# Patient Record
Sex: Female | Born: 1950 | Race: White | Hispanic: No | Marital: Married | Smoking: Never smoker
Health system: Southern US, Community
[De-identification: ages and names within clinical notes are randomized; demographics above are authoritative.]

## PROBLEM LIST (undated history)

## (undated) DIAGNOSIS — M1990 Unspecified osteoarthritis, unspecified site: Secondary | ICD-10-CM

## (undated) DIAGNOSIS — H04123 Dry eye syndrome of bilateral lacrimal glands: Secondary | ICD-10-CM

## (undated) DIAGNOSIS — T7840XA Allergy, unspecified, initial encounter: Secondary | ICD-10-CM

## (undated) DIAGNOSIS — K219 Gastro-esophageal reflux disease without esophagitis: Secondary | ICD-10-CM

## (undated) HISTORY — DX: Dry eye syndrome of bilateral lacrimal glands: H04.123

## (undated) HISTORY — PX: BREAST EXCISIONAL BIOPSY: SUR124

## (undated) HISTORY — PX: COLONOSCOPY: SHX174

## (undated) HISTORY — DX: Allergy, unspecified, initial encounter: T78.40XA

## (undated) HISTORY — PX: OTHER SURGICAL HISTORY: SHX169

## (undated) HISTORY — PX: MOLE REMOVAL: SHX2046

## (undated) HISTORY — PX: WISDOM TOOTH EXTRACTION: SHX21

## (undated) HISTORY — PX: TONSILLECTOMY: SUR1361

## (undated) HISTORY — DX: Unspecified osteoarthritis, unspecified site: M19.90

## (undated) HISTORY — PX: LIPOMA EXCISION: SHX5283

## (undated) HISTORY — DX: Gastro-esophageal reflux disease without esophagitis: K21.9

---

## 2007-02-23 ENCOUNTER — Other Ambulatory Visit: Admission: RE | Admit: 2007-02-23 | Discharge: 2007-02-23 | Payer: Self-pay | Admitting: Obstetrics and Gynecology

## 2009-02-23 ENCOUNTER — Other Ambulatory Visit: Admission: RE | Admit: 2009-02-23 | Discharge: 2009-02-23 | Payer: Self-pay | Admitting: Internal Medicine

## 2009-04-06 ENCOUNTER — Ambulatory Visit: Payer: Self-pay | Admitting: Internal Medicine

## 2009-05-03 ENCOUNTER — Ambulatory Visit: Payer: Self-pay | Admitting: Internal Medicine

## 2011-02-25 ENCOUNTER — Other Ambulatory Visit: Payer: Self-pay | Admitting: Internal Medicine

## 2012-06-21 ENCOUNTER — Other Ambulatory Visit (HOSPITAL_COMMUNITY)
Admission: RE | Admit: 2012-06-21 | Discharge: 2012-06-21 | Disposition: A | Discharge status: Home / Self Care | Payer: 59 | Source: Ambulatory Visit | Attending: Internal Medicine | Admitting: Internal Medicine

## 2012-06-21 DIAGNOSIS — Z1151 Encounter for screening for human papillomavirus (HPV): Secondary | ICD-10-CM | POA: Insufficient documentation

## 2012-06-21 DIAGNOSIS — Z01419 Encounter for gynecological examination (general) (routine) without abnormal findings: Secondary | ICD-10-CM | POA: Insufficient documentation

## 2012-11-23 ENCOUNTER — Ambulatory Visit (INDEPENDENT_AMBULATORY_CARE_PROVIDER_SITE_OTHER): Payer: 59 | Admitting: General Practice

## 2012-11-23 VITALS — BP 120/80 | HR 84 | Temp 97.9°F | Ht 66.0 in | Wt 159.0 lb

## 2012-11-23 DIAGNOSIS — J322 Chronic ethmoidal sinusitis: Secondary | ICD-10-CM

## 2012-11-23 MED ORDER — AZITHROMYCIN 250 MG PO TABS: ORAL_TABLET | ORAL | Status: DC

## 2012-11-23 NOTE — Progress Notes (Signed)
  Subjective:    Patient ID: Amanda Woodward, female    DOB: [birth year], 62 y.o.   MRN: [id number]  HPI Presents today with sinus drainage, blood tinged. Reports headache and sinus pressure. Reports taking zyrtec D with minimal relief. Reports onset of symptoms as Saturday.     Review of Systems  Constitutional: Positive for fever. Negative for chills.       Denies taking temperature   HENT: Positive for congestion, rhinorrhea, postnasal drip and sinus pressure. Negative for ear pain, sore throat and neck pain.   Respiratory: Negative for cough and chest tightness.   Cardiovascular: Negative for chest pain.  Gastrointestinal: Positive for vomiting.       Vomiting lasted from Saturday night through Sunday morning  Skin: Negative.   Neurological: Positive for headaches. Negative for dizziness.  Psychiatric/Behavioral: Negative.        Objective:   Physical Exam  Constitutional: She is oriented to person, place, and time. She appears well-developed and well-nourished.  HENT:  Head: Normocephalic and atraumatic.  Right Ear: External ear normal.  Left Ear: External ear normal.  Nose: Right sinus exhibits maxillary sinus tenderness. Left sinus exhibits maxillary sinus tenderness.  Eyes: Conjunctivae are normal.  Neck: Normal range of motion. No thyromegaly present.  Cardiovascular: Normal rate, regular rhythm and normal heart sounds.   No murmur heard. Pulmonary/Chest: Effort normal and breath sounds normal. No respiratory distress. She exhibits no tenderness.  Neurological: She is alert and oriented to person, place, and time.  Skin: Skin is warm and dry.  Psychiatric: She has a normal mood and affect.          Assessment & Plan:  Ethmoid sinusitis - Plan: azithromycin (ZITHROMAX) 250 MG tablet  Increase fluid intake Motrin or tylenol for discomfort Rest Patient verbalized understandingRTO if symptoms worsen Coralie Keens, FNP-C

## 2012-11-23 NOTE — Patient Instructions (Signed)

## 2013-03-07 ENCOUNTER — Ambulatory Visit (INDEPENDENT_AMBULATORY_CARE_PROVIDER_SITE_OTHER): Payer: 59 | Admitting: General Practice

## 2013-03-07 VITALS — BP 133/78 | HR 52 | Temp 97.5°F | Ht 66.5 in | Wt 166.0 lb

## 2013-03-07 DIAGNOSIS — N390 Urinary tract infection, site not specified: Secondary | ICD-10-CM

## 2013-03-07 DIAGNOSIS — R109 Unspecified abdominal pain: Secondary | ICD-10-CM

## 2013-03-07 LAB — POCT URINALYSIS DIPSTICK
Bilirubin, UA: NEGATIVE
Ketones, UA: NEGATIVE
Leukocytes, UA: NEGATIVE
Protein, UA: NEGATIVE
Spec Grav, UA: 1.01
pH, UA: 5

## 2013-03-07 LAB — POCT UA - MICROSCOPIC ONLY
Crystals, Ur, HPF, POC: NEGATIVE
Mucus, UA: NEGATIVE

## 2013-03-07 MED ORDER — CIPROFLOXACIN HCL 500 MG PO TABS: 500.0000 mg | ORAL_TABLET | Freq: Two times a day (BID) | ORAL | Status: DC

## 2013-03-07 NOTE — Progress Notes (Signed)
  Subjective:    Patient ID: Amanda Woodward, female    DOB: Aug 26, 1950, 62 y.o.   MRN: [id number]  Urinary Tract Infection  This is a new problem. The current episode started in the past 7 days. The problem occurs every urination. The problem has been gradually improving. The quality of the pain is described as aching. There has been no fever. She is sexually active. There is no history of pyelonephritis. Associated symptoms include flank pain, frequency, hematuria and urgency. Pertinent negatives include no chills. She has tried NSAIDs for the symptoms. There is no history of kidney stones or recurrent UTIs.      Review of Systems  Constitutional: Negative for fever and chills.  Respiratory: Negative for chest tightness and shortness of breath.   Cardiovascular: Negative for chest pain and palpitations.  Gastrointestinal: Negative for abdominal pain.  Genitourinary: Positive for dysuria, urgency, frequency, hematuria and flank pain. Negative for difficulty urinating.  Neurological: Negative for dizziness, weakness and headaches.       Objective:   Physical Exam  Constitutional: She is oriented to person, place, and time. She appears well-developed and well-nourished.  Cardiovascular: Normal rate, regular rhythm and normal heart sounds.   Pulmonary/Chest: Effort normal and breath sounds normal. No respiratory distress. She exhibits no tenderness.  Abdominal: Soft. Bowel sounds are normal. She exhibits no distension. There is no tenderness.  Neurological: She is alert and oriented to person, place, and time.  Skin: Skin is warm and dry.  Psychiatric: She has a normal mood and affect.   Results for orders placed in visit on 03/07/13  POCT UA - MICROSCOPIC ONLY      Result Value Range   WBC, Ur, HPF, POC occ     RBC, urine, microscopic 1-5     Bacteria, U Microscopic occ     Mucus, UA negative     Epithelial cells, urine per micros occ     Crystals, Ur, HPF, POC negative     Casts,  Ur, LPF, POC negative     Yeast, UA negative    POCT URINALYSIS DIPSTICK      Result Value Range   Color, UA gold     Clarity, UA clear     Glucose, UA negative     Bilirubin, UA negative     Ketones, UA negative     Spec Grav, UA 1.010     Blood, UA moderate     pH, UA 5.0     Protein, UA negative     Urobilinogen, UA negative     Nitrite, UA negative     Leukocytes, UA Negative           Assessment & Plan:  1. Abdominal discomfort - POCT UA - Microscopic Only - POCT urinalysis dipstick  2. Urinary tract infection, site not specified - ciprofloxacin (CIPRO) 500 MG tablet; Take 1 tablet (500 mg total) by mouth 2 (two) times daily.  Dispense: 20 tablet; Refill: 0 -Increase fluid intake Frequent voiding Proper perineal hygiene RTO prn  Patient verbalized understanding Coralie Keens, FNP-C

## 2013-03-07 NOTE — Patient Instructions (Addendum)
Urinary Tract Infection  Urinary tract infections (UTIs) can develop anywhere along your urinary tract. Your urinary tract is your body's drainage system for removing wastes and extra water. Your urinary tract includes two kidneys, two ureters, a bladder, and a urethra. Your kidneys are a pair of bean-shaped organs. Each kidney is about the size of your fist. They are located below your ribs, one on each side of your spine.  CAUSES  Infections are caused by microbes, which are microscopic organisms, including fungi, viruses, and bacteria. These organisms are so small that they can only be seen through a microscope. Bacteria are the microbes that most commonly cause UTIs.  SYMPTOMS   Symptoms of UTIs may vary by age and gender of the patient and by the location of the infection. Symptoms in young women typically include a frequent and intense urge to urinate and a painful, burning feeling in the bladder or urethra during urination. Older women and men are more likely to be tired, shaky, and weak and have muscle aches and abdominal pain. A fever may mean the infection is in your kidneys. Other symptoms of a kidney infection include pain in your back or sides below the ribs, nausea, and vomiting.  DIAGNOSIS  To diagnose a UTI, your caregiver will ask you about your symptoms. Your caregiver also will ask to provide a urine sample. The urine sample will be tested for bacteria and white blood cells. White blood cells are made by your body to help fight infection.  TREATMENT   Typically, UTIs can be treated with medication. Because most UTIs are caused by a bacterial infection, they usually can be treated with the use of antibiotics. The choice of antibiotic and length of treatment depend on your symptoms and the type of bacteria causing your infection.  HOME CARE INSTRUCTIONS   If you were prescribed antibiotics, take them exactly as your caregiver instructs you. Finish the medication even if you feel better after you  have only taken some of the medication.   Drink enough water and fluids to keep your urine clear or pale yellow.   Avoid caffeine, tea, and carbonated beverages. They tend to irritate your bladder.   Empty your bladder often. Avoid holding urine for long periods of time.   Empty your bladder before and after sexual intercourse.   After a bowel movement, women should cleanse from front to back. Use each tissue only once.  SEEK MEDICAL CARE IF:    You have back pain.   You develop a fever.   Your symptoms do not begin to resolve within 3 days.  SEEK IMMEDIATE MEDICAL CARE IF:    You have severe back pain or lower abdominal pain.   You develop chills.   You have nausea or vomiting.   You have continued burning or discomfort with urination.  MAKE SURE YOU:    Understand these instructions.   Will watch your condition.   Will get help right away if you are not doing well or get worse.  Document Released: 04/09/2005 Document Revised: 12/30/2011 Document Reviewed: 08/08/2011  [employer] Patient Information 2014 [employer].

## 2013-03-15 ENCOUNTER — Telehealth: Payer: Self-pay | Admitting: General Practice

## 2013-03-15 ENCOUNTER — Other Ambulatory Visit: Payer: Self-pay | Admitting: General Practice

## 2013-03-15 DIAGNOSIS — N760 Acute vaginitis: Secondary | ICD-10-CM

## 2013-03-15 MED ORDER — FLUCONAZOLE 150 MG PO TABS: 150.0000 mg | ORAL_TABLET | Freq: Once | ORAL | Status: DC

## 2013-03-15 NOTE — Telephone Encounter (Signed)
Left message, rx sent in.

## 2013-03-15 NOTE — Telephone Encounter (Signed)
Please inform that script sent for yeast infection

## 2013-03-15 NOTE — Telephone Encounter (Signed)
NTBS or call in rx?

## 2013-03-16 ENCOUNTER — Telehealth: Payer: Self-pay | Admitting: General Practice

## 2013-03-16 NOTE — Telephone Encounter (Signed)
Are we giving husband a rx ?

## 2013-03-17 NOTE — Telephone Encounter (Signed)
Please advise 

## 2013-03-18 NOTE — Telephone Encounter (Signed)
Pt aware, men do get yeast infections and can take meds for it but he should be seen and checked before a med could be prescribed.

## 2013-07-26 ENCOUNTER — Ambulatory Visit: Payer: 59 | Attending: Internal Medicine | Admitting: Physical Therapy

## 2013-07-26 DIAGNOSIS — M25519 Pain in unspecified shoulder: Secondary | ICD-10-CM | POA: Insufficient documentation

## 2013-07-26 DIAGNOSIS — IMO0001 Reserved for inherently not codable concepts without codable children: Secondary | ICD-10-CM | POA: Insufficient documentation

## 2013-07-26 DIAGNOSIS — M25619 Stiffness of unspecified shoulder, not elsewhere classified: Secondary | ICD-10-CM | POA: Insufficient documentation

## 2013-07-26 DIAGNOSIS — R5381 Other malaise: Secondary | ICD-10-CM | POA: Insufficient documentation

## 2013-07-28 ENCOUNTER — Ambulatory Visit: Payer: 59 | Admitting: Physical Therapy

## 2013-08-01 ENCOUNTER — Ambulatory Visit: Payer: 59 | Admitting: Physical Therapy

## 2013-08-05 ENCOUNTER — Ambulatory Visit: Payer: 59 | Admitting: *Deleted

## 2013-08-09 ENCOUNTER — Ambulatory Visit: Payer: 59 | Admitting: Physical Therapy

## 2013-08-12 ENCOUNTER — Ambulatory Visit: Payer: 59 | Admitting: Physical Therapy

## 2013-08-16 ENCOUNTER — Ambulatory Visit: Payer: 59 | Attending: Internal Medicine | Admitting: *Deleted

## 2013-08-16 DIAGNOSIS — R5381 Other malaise: Secondary | ICD-10-CM | POA: Insufficient documentation

## 2013-08-16 DIAGNOSIS — M25519 Pain in unspecified shoulder: Secondary | ICD-10-CM | POA: Insufficient documentation

## 2013-08-16 DIAGNOSIS — IMO0001 Reserved for inherently not codable concepts without codable children: Secondary | ICD-10-CM | POA: Insufficient documentation

## 2013-08-16 DIAGNOSIS — M25619 Stiffness of unspecified shoulder, not elsewhere classified: Secondary | ICD-10-CM | POA: Insufficient documentation

## 2013-08-19 ENCOUNTER — Ambulatory Visit: Payer: 59 | Admitting: *Deleted

## 2013-08-23 ENCOUNTER — Encounter: Payer: 59 | Admitting: Physical Therapy

## 2013-08-26 ENCOUNTER — Ambulatory Visit: Payer: 59 | Admitting: Physical Therapy

## 2013-08-30 ENCOUNTER — Encounter: Payer: 59 | Admitting: Physical Therapy

## 2013-09-02 ENCOUNTER — Encounter: Payer: 59 | Admitting: Physical Therapy

## 2014-03-10 ENCOUNTER — Encounter: Payer: Self-pay | Admitting: Internal Medicine

## 2014-04-04 ENCOUNTER — Ambulatory Visit: Payer: 59 | Admitting: Physical Therapy

## 2014-04-06 ENCOUNTER — Ambulatory Visit: Payer: 59 | Attending: Internal Medicine | Admitting: Physical Therapy

## 2014-04-06 DIAGNOSIS — M545 Low back pain, unspecified: Secondary | ICD-10-CM | POA: Diagnosis not present

## 2014-04-06 DIAGNOSIS — IMO0001 Reserved for inherently not codable concepts without codable children: Secondary | ICD-10-CM | POA: Insufficient documentation

## 2014-04-06 DIAGNOSIS — R5381 Other malaise: Secondary | ICD-10-CM | POA: Insufficient documentation

## 2014-04-11 ENCOUNTER — Ambulatory Visit: Payer: 59 | Admitting: *Deleted

## 2014-04-11 DIAGNOSIS — IMO0001 Reserved for inherently not codable concepts without codable children: Secondary | ICD-10-CM | POA: Diagnosis not present

## 2014-04-13 ENCOUNTER — Ambulatory Visit: Payer: 59 | Attending: Internal Medicine | Admitting: *Deleted

## 2014-04-13 DIAGNOSIS — R5381 Other malaise: Secondary | ICD-10-CM | POA: Insufficient documentation

## 2014-04-13 DIAGNOSIS — Z5189 Encounter for other specified aftercare: Secondary | ICD-10-CM | POA: Insufficient documentation

## 2014-04-13 DIAGNOSIS — M545 Low back pain: Secondary | ICD-10-CM | POA: Diagnosis not present

## 2014-04-18 ENCOUNTER — Ambulatory Visit: Payer: 59 | Admitting: *Deleted

## 2014-04-18 DIAGNOSIS — Z5189 Encounter for other specified aftercare: Secondary | ICD-10-CM | POA: Diagnosis not present

## 2014-04-20 ENCOUNTER — Ambulatory Visit: Payer: 59 | Admitting: *Deleted

## 2014-04-20 DIAGNOSIS — Z5189 Encounter for other specified aftercare: Secondary | ICD-10-CM | POA: Diagnosis not present

## 2014-04-26 ENCOUNTER — Encounter: Payer: 59 | Admitting: Physical Therapy

## 2014-05-05 ENCOUNTER — Encounter: Payer: Self-pay | Admitting: Family Medicine

## 2014-05-05 ENCOUNTER — Ambulatory Visit (INDEPENDENT_AMBULATORY_CARE_PROVIDER_SITE_OTHER): Payer: 59 | Admitting: Family Medicine

## 2014-05-05 ENCOUNTER — Telehealth: Payer: Self-pay | Admitting: Family Medicine

## 2014-05-05 VITALS — BP 126/73 | HR 85 | Temp 98.3°F | Ht 66.5 in | Wt 173.8 lb

## 2014-05-05 DIAGNOSIS — J206 Acute bronchitis due to rhinovirus: Secondary | ICD-10-CM

## 2014-05-05 MED ORDER — HYDROCOD POLST-CHLORPHEN POLST 10-8 MG/5ML PO LQCR: 5.0000 mL | Freq: Two times a day (BID) | ORAL | Status: DC | PRN

## 2014-05-05 MED ORDER — AZITHROMYCIN 250 MG PO TABS: ORAL_TABLET | ORAL | Status: DC

## 2014-05-05 NOTE — Progress Notes (Signed)
   Subjective:    Patient ID: Amanda Woodward, female    DOB: 10-01-1950, 63 y.o.   MRN: [id number]  HPI C/o cough and uri sx's  Review of Systems  Constitutional: Negative for fever.  HENT: Negative for ear pain.   Eyes: Negative for discharge.  Respiratory: Negative for cough.   Cardiovascular: Negative for chest pain.  Gastrointestinal: Negative for abdominal distention.  Endocrine: Negative for polyuria.  Genitourinary: Negative for difficulty urinating.  Musculoskeletal: Negative for gait problem and neck pain.  Skin: Negative for color change and rash.  Neurological: Negative for speech difficulty and headaches.  Psychiatric/Behavioral: Negative for agitation.       Objective:    BP 126/73  Pulse 85  Temp(Src) 98.3 F (36.8 C) (Oral)  Ht 5' 6.5" (1.689 m)  Wt 173 lb 12.8 oz (78.835 kg)  BMI 27.64 kg/m2 Physical Exam  Constitutional: She is oriented to person, place, and time. She appears well-developed and well-nourished.  HENT:  Head: Normocephalic and atraumatic.  Mouth/Throat: Oropharynx is clear and moist.  Eyes: Pupils are equal, round, and reactive to light.  Neck: Normal range of motion. Neck supple.  Cardiovascular: Normal rate and regular rhythm.   No murmur heard. Pulmonary/Chest: Effort normal and breath sounds normal.  Abdominal: Soft. Bowel sounds are normal. There is no tenderness.  Neurological: She is alert and oriented to person, place, and time.  Skin: Skin is warm and dry.  Psychiatric: She has a normal mood and affect.          Assessment & Plan:     ICD-9-CM ICD-10-CM   1. Acute bronchitis due to Rhinovirus 466.0 J20.6 azithromycin (ZITHROMAX) 250 MG tablet   079.3  chlorpheniramine-HYDROcodone (TUSSIONEX PENNKINETIC ER) 10-8 MG/5ML LQCR     No Follow-up on file.  Lysbeth Penner FNP

## 2014-05-12 ENCOUNTER — Ambulatory Visit: Payer: 59 | Admitting: *Deleted

## 2014-05-12 DIAGNOSIS — Z5189 Encounter for other specified aftercare: Secondary | ICD-10-CM | POA: Diagnosis not present

## 2014-05-16 ENCOUNTER — Ambulatory Visit: Payer: Managed Care, Other (non HMO) | Attending: Internal Medicine | Admitting: *Deleted

## 2014-05-16 DIAGNOSIS — M545 Low back pain: Secondary | ICD-10-CM | POA: Diagnosis not present

## 2014-05-16 DIAGNOSIS — Z5189 Encounter for other specified aftercare: Secondary | ICD-10-CM | POA: Diagnosis present

## 2014-05-16 DIAGNOSIS — R5381 Other malaise: Secondary | ICD-10-CM | POA: Insufficient documentation

## 2014-05-23 ENCOUNTER — Ambulatory Visit: Payer: Managed Care, Other (non HMO) | Admitting: Physical Therapy

## 2014-05-23 DIAGNOSIS — Z5189 Encounter for other specified aftercare: Secondary | ICD-10-CM | POA: Diagnosis not present

## 2014-05-30 ENCOUNTER — Encounter: Payer: 59 | Admitting: Physical Therapy

## 2014-05-31 ENCOUNTER — Telehealth: Payer: Self-pay | Admitting: Family Medicine

## 2014-05-31 NOTE — Telephone Encounter (Signed)
Pt given appt tomorrow @ 4:45 with MMM

## 2014-06-01 ENCOUNTER — Ambulatory Visit: Payer: 59 | Admitting: Nurse Practitioner

## 2014-09-13 ENCOUNTER — Encounter: Payer: Self-pay | Admitting: Internal Medicine

## 2014-09-18 ENCOUNTER — Other Ambulatory Visit (HOSPITAL_COMMUNITY)
Admission: RE | Admit: 2014-09-18 | Discharge: 2014-09-18 | Disposition: A | Discharge status: Home / Self Care | Payer: 59 | Source: Ambulatory Visit | Attending: Internal Medicine | Admitting: Internal Medicine

## 2014-09-18 ENCOUNTER — Other Ambulatory Visit: Payer: Self-pay | Admitting: Internal Medicine

## 2014-09-18 DIAGNOSIS — Z01419 Encounter for gynecological examination (general) (routine) without abnormal findings: Secondary | ICD-10-CM | POA: Insufficient documentation

## 2014-09-18 DIAGNOSIS — Z1151 Encounter for screening for human papillomavirus (HPV): Secondary | ICD-10-CM | POA: Diagnosis present

## 2014-09-20 LAB — CYTOLOGY - PAP

## 2015-03-30 ENCOUNTER — Emergency Department (HOSPITAL_COMMUNITY)
Admission: EM | Admit: 2015-03-30 | Discharge: 2015-03-30 | Disposition: A | Discharge status: Home / Self Care | Payer: Managed Care, Other (non HMO) | Attending: Emergency Medicine | Admitting: Emergency Medicine

## 2015-03-30 ENCOUNTER — Encounter (HOSPITAL_COMMUNITY): Payer: Self-pay | Admitting: Emergency Medicine

## 2015-03-30 ENCOUNTER — Encounter: Payer: Self-pay | Admitting: Physician Assistant

## 2015-03-30 ENCOUNTER — Ambulatory Visit (INDEPENDENT_AMBULATORY_CARE_PROVIDER_SITE_OTHER): Payer: Managed Care, Other (non HMO) | Admitting: Family Medicine

## 2015-03-30 ENCOUNTER — Emergency Department (HOSPITAL_COMMUNITY): Payer: Managed Care, Other (non HMO)

## 2015-03-30 VITALS — BP 134/84 | HR 56 | Temp 97.1°F | Ht 66.5 in | Wt 175.0 lb

## 2015-03-30 DIAGNOSIS — M542 Cervicalgia: Secondary | ICD-10-CM | POA: Insufficient documentation

## 2015-03-30 DIAGNOSIS — H9202 Otalgia, left ear: Secondary | ICD-10-CM | POA: Insufficient documentation

## 2015-03-30 DIAGNOSIS — R110 Nausea: Secondary | ICD-10-CM | POA: Diagnosis not present

## 2015-03-30 DIAGNOSIS — R079 Chest pain, unspecified: Secondary | ICD-10-CM

## 2015-03-30 DIAGNOSIS — R5383 Other fatigue: Secondary | ICD-10-CM | POA: Diagnosis not present

## 2015-03-30 LAB — BASIC METABOLIC PANEL
ANION GAP: 6 (ref 5–15)
BUN: 20 mg/dL (ref 6–20)
CHLORIDE: 106 mmol/L (ref 101–111)
CO2: 26 mmol/L (ref 22–32)
CREATININE: 0.92 mg/dL (ref 0.44–1.00)
Calcium: 9.1 mg/dL (ref 8.9–10.3)
GFR calc non Af Amer: >60 mL/min (ref >60)
Glucose, Bld: 107 mg/dL — ABNORMAL HIGH (ref 65–99)
POTASSIUM: 4.8 mmol/L (ref 3.5–5.1)
SODIUM: 138 mmol/L (ref 135–145)

## 2015-03-30 LAB — CBC
HCT: 41.3 % (ref 36.0–46.0)
Hemoglobin: 13.6 g/dL (ref 12.0–15.0)
MCH: 29.8 pg (ref 26.0–34.0)
MCHC: 32.9 g/dL (ref 30.0–36.0)
MCV: 90.6 fL (ref 78.0–100.0)
PLATELETS: 242 10*3/uL (ref 150–400)
RBC: 4.56 MIL/uL (ref 3.87–5.11)
RDW: 13.7 % (ref 11.5–15.5)
WBC: 6.9 10*3/uL (ref 4.0–10.5)

## 2015-03-30 LAB — I-STAT TROPONIN, ED
TROPONIN I, POC: 0 ng/mL (ref 0.00–0.08)
Troponin i, poc: 0 ng/mL (ref 0.00–0.08)

## 2015-03-30 MED ORDER — ASPIRIN EC 81 MG PO TBEC: 324.0000 mg | DELAYED_RELEASE_TABLET | Freq: Every day | ORAL | Status: DC

## 2015-03-30 MED ORDER — NITROGLYCERIN 0.4 MG SL SUBL
0.4000 mg | SUBLINGUAL_TABLET | Freq: Once | SUBLINGUAL | Status: AC
  Administered 2015-03-30: 0.4 mg via SUBLINGUAL

## 2015-03-30 MED ORDER — ASPIRIN 81 MG PO CHEW
324.0000 mg | CHEWABLE_TABLET | Freq: Once | ORAL | Status: AC
  Administered 2015-03-30: 324 mg via ORAL

## 2015-03-30 NOTE — ED Provider Notes (Signed)
CSN: [id number]     Arrival date & time 03/30/15  1128 History   First MD Initiated Contact with Patient 03/30/15 1524     Chief Complaint  Patient presents with  . Chest Pain   HPI  Amanda Woodward is a 64 year old female with PMHx of arthritis presenting today with chest pain. Pt states that around 9 AM this morning she began have left ear pain that radiated into her left jaw and teeth and further progressed down the left side of her neck into her chest. Pt describes the pain tight and heavy pressure. Pt denies lightheadedness, dizziness, diaphoresis, palpitations, nausea or vomiting with the chest pain. She decided to go to her urgent care in [place] where she received 4 ASA and 1 nitro. Pt reports that her chest pain was beginning to resolve approximately 15-20 minutes after it started and then fully resolved after receiving ASA and nitro. States the nitro made her nauseous and fatigued. Pt currently denies chest pain. No personal cardiac history. No family cardiac history. Denies fevers, chills, headache, dizziness, lightheadedness, syncope, SOB, palpitations, abdominal pain, vomiting.   Past Medical History  Diagnosis Date  . Arthritis    Past Surgical History  Procedure Laterality Date  . Tonsillectomy    . Cesarean section    . Wisdom tooth extraction    . Vein removal right leg    . Lipoma excision  LEFT SHOULDER   Family History  Problem Relation Age of Onset  . Cancer Mother   . Arthritis Mother   . COPD Father   . Cancer Father   . Heart disease Father    Social History  Substance Use Topics  . Smoking status: Never Smoker   . Smokeless tobacco: Never Used  . Alcohol Use: No   OB History    No data available     Review of Systems  Constitutional: Positive for fatigue. Negative for fever, chills and diaphoresis.  HENT: Positive for ear pain.   Respiratory: Positive for chest tightness. Negative for shortness of breath.   Cardiovascular: Positive for chest pain.  Negative for palpitations.  Gastrointestinal: Positive for nausea. Negative for vomiting, abdominal pain and diarrhea.  Musculoskeletal: Positive for neck pain. Negative for back pain.  Neurological: Negative for dizziness, syncope, weakness, light-headedness and headaches.      Allergies  Benadryl  Home Medications   Prior to Admission medications   Medication Sig Start Date End Date Taking? Authorizing Provider  naproxen sodium (ANAPROX) 220 MG tablet Take 220 mg by mouth 2 (two) times daily as needed (pain).   Yes Historical Provider, MD   BP 134/65 mmHg  Pulse 56  Temp(Src) 98.2 F (36.8 C) (Oral)  Resp 19  Ht 5\' 6"  (1.676 m)  Wt 177 lb (80.287 kg)  BMI 28.58 kg/m2  SpO2 100% Physical Exam  Constitutional: She is oriented to person, place, and time. She appears well-developed and well-nourished. No distress.  HENT:  Head: Normocephalic and atraumatic.  Eyes: Conjunctivae and EOM are normal. Pupils are equal, round, and reactive to light. Right eye exhibits no discharge. Left eye exhibits no discharge. No scleral icterus.  Neck: Normal range of motion.  Cardiovascular: Normal rate, regular rhythm and normal heart sounds.   Pulmonary/Chest: Effort normal and breath sounds normal. No respiratory distress. She has no wheezes. She has no rales.  Breathing unlabored  Abdominal: Soft. There is no tenderness. There is no rebound and no guarding.  Musculoskeletal: Normal range of motion.  Neurological: She is alert and oriented to person, place, and time. No cranial nerve deficit. Coordination normal.  5/5 motor strength BUE BLE. Sensation to light touch throughout.  Skin: Skin is warm and dry.  Psychiatric: She has a normal mood and affect. Her behavior is normal.  Nursing note and vitals reviewed.   ED Course  Procedures (including critical care time) Labs Review Labs Reviewed  BASIC METABOLIC PANEL - Abnormal; Notable for the following:    Glucose, Bld 107 (*)    All  other components within normal limits  CBC  I-STAT TROPOININ, ED  Randolm Idol, ED    Imaging Review Dg Chest 2 View  03/30/2015   CLINICAL DATA:  LEFT EAR PAIN-LEFT JAW PAIN-MID CHEST PAIN,BELCHING NOW  EXAM: CHEST  2 VIEW  COMPARISON:  None.  FINDINGS: The heart size and mediastinal contours are within normal limits. Both lungs are clear. Mid thoracic spondylosis.  IMPRESSION: No active cardiopulmonary disease.   Electronically Signed   By: Nolon Nations M.D.   On: 03/30/2015 12:23   I have personally reviewed and evaluated these images and lab results as part of my medical decision-making.   EKG Interpretation   Date/Time:  Friday March 30 2015 16:54:59 EDT Ventricular Rate:  55 PR Interval:  139 QRS Duration: 93 QT Interval:  431 QTC Calculation: 412 R Axis:   86 Text Interpretation:  Sinus rhythm Consider right ventricular hypertrophy  ECG OTHERWISE WITHIN NORMAL LIMITS Confirmed by Christy Gentles  MD, Elenore Rota  [id number]) on 03/30/2015 5:01:25 PM      MDM   Final diagnoses:  Chest pain, unspecified chest pain type   Pt presenting with left ear, jaw, neck and chest pain. Pain began resolving in 15-20 minutes before arrival to UC, fully resolved after ASA and nitro. Denies diaphoresis, dizziness, lightheadedness, SOB, nausea with chest pain. Heart score less than 3. EKG normal sinus with no ischemic changes. VSS. Heart RRR. Breathing unlabored, CTAB. Non-focal neuro exam. Troponins drawn 3 hours apart 0.00. CXR negative. Pt seen in conjunction with Dr. Christy Gentles who agrees with assessment and plan. Pt will follow up with her primary care doctor early next week. Strict return precautions given in discharge paperwork and discussed with pt. Pt stable for discharge.     Josephina Gip, PA-C 03/30/15 2214  Ripley Fraise, MD 03/31/15 Laureen Abrahams

## 2015-03-30 NOTE — ED Provider Notes (Signed)
Patient seen/examined in the Emergency Department in conjunction with Midlevel Provider  Patient reports chest pain Exam : awake/alert, no distress Plan: HEART less than 3 Pt is well appearing Will get repeat troponin If negative pt will be discharged   EKG Interpretation  Date/Time:  Friday March 30 2015 16:54:59 EDT Ventricular Rate:  55 PR Interval:  139 QRS Duration: 93 QT Interval:  431 QTC Calculation: 412 R Axis:   86 Text Interpretation:  Sinus rhythm Consider right ventricular hypertrophy ECG OTHERWISE WITHIN NORMAL LIMITS Confirmed by Christy Gentles  MD, DONALD ([id number]) on 03/30/2015 5:01:25 PM         Ripley Fraise, MD 03/30/15 1704

## 2015-03-30 NOTE — Discharge Instructions (Signed)

## 2015-03-30 NOTE — Progress Notes (Signed)
   HPI  Patient presents today here at chest pain  Patient explains that this morning she was talking on phone she developed left ear pain but then moved to her left jaw and down her left neck and settled as a heavy pressure type chest pain in her left chest. She states that it's persisted since that time it is easing up slightly. She states that it's been going on for about one hour.  She's had for aspirin since arriving our clinic.  She is breathing easily. She denies any palpitations, sweating, abdominal upset, or history of heart attacks. She also denies hypertension, hyperlipidemia, or family history of coronary disease.  She states the pain has continued but is much less severe than previous.  PMH: Smoking status noted - nonsmoker ROS: Per HPI  Objective: BP 134/84 mmHg  Pulse 56  Temp(Src) 97.1 F (36.2 C) (Oral)  Ht 5' 6.5" (1.689 m)  Wt 175 lb (79.379 kg)  BMI 27.83 kg/m2 Gen: NAD, alert, cooperative with exam HEENT: NCAT, TM study and LDL CV: RRR, good S1/S2, no murmur Resp: CTABL, no wheezes, non-labored Ext: No edema, warm Neuro: Alert and oriented, No gross deficits  EKG: NSR, no ischemic changes  Assessment and plan:  # Chest pain Given her age and character of chest pain I am concerned about cardiac etiology. EKG today is normal sinus rhythm with no ischemic characteristics Recommend transfer to the emergency room for delta troponins She does have improvement with nitro as well She was advised to go to the ed via ambulance Her only cardiac risk factor is age  Orders Placed This Encounter  Procedures  . EKG 12-Lead    Meds ordered this encounter  Medications  . aspirin EC tablet 324 mg    Sig:     Laroy Apple, MD [facility] 03/30/2015, 10:09 AM

## 2015-03-30 NOTE — ED Notes (Signed)
Pt reports at 9am she began to experience pain in her L ear/jaw area and it progressively spread across her chest. Seen at uc this morning and given 324 asa and 1 nitro. Repots pain has eased now. Reports nausea after nitro. Skin warm and dry. resp e/u

## 2015-04-19 ENCOUNTER — Ambulatory Visit (INDEPENDENT_AMBULATORY_CARE_PROVIDER_SITE_OTHER): Payer: Managed Care, Other (non HMO) | Admitting: *Deleted

## 2015-04-19 DIAGNOSIS — Z23 Encounter for immunization: Secondary | ICD-10-CM | POA: Diagnosis not present

## 2015-04-20 ENCOUNTER — Encounter: Payer: Self-pay | Admitting: Gastroenterology

## 2015-06-13 ENCOUNTER — Ambulatory Visit (AMBULATORY_SURGERY_CENTER): Payer: Self-pay | Admitting: *Deleted

## 2015-06-13 VITALS — Ht 66.0 in | Wt 182.4 lb

## 2015-06-13 DIAGNOSIS — Z800 Family history of malignant neoplasm of digestive organs: Secondary | ICD-10-CM

## 2015-06-13 MED ORDER — NA SULFATE-K SULFATE-MG SULF 17.5-3.13-1.6 GM/177ML PO SOLN: 1.0000 | Freq: Once | ORAL | Status: DC

## 2015-06-13 NOTE — Progress Notes (Signed)
No egg or soy allergy No issues with past sedation No diet pills No home 02 use     

## 2015-06-19 ENCOUNTER — Encounter: Payer: Self-pay | Admitting: Gastroenterology

## 2015-06-27 ENCOUNTER — Ambulatory Visit (AMBULATORY_SURGERY_CENTER): Payer: Managed Care, Other (non HMO) | Admitting: Gastroenterology

## 2015-06-27 ENCOUNTER — Encounter: Payer: Self-pay | Admitting: Gastroenterology

## 2015-06-27 VITALS — BP 138/92 | HR 63 | Temp 98.0°F | Resp 20 | Ht 66.0 in | Wt 182.0 lb

## 2015-06-27 DIAGNOSIS — Z800 Family history of malignant neoplasm of digestive organs: Secondary | ICD-10-CM | POA: Diagnosis present

## 2015-06-27 DIAGNOSIS — D120 Benign neoplasm of cecum: Secondary | ICD-10-CM

## 2015-06-27 DIAGNOSIS — Z1211 Encounter for screening for malignant neoplasm of colon: Secondary | ICD-10-CM | POA: Diagnosis not present

## 2015-06-27 DIAGNOSIS — K635 Polyp of colon: Secondary | ICD-10-CM

## 2015-06-27 MED ORDER — SODIUM CHLORIDE 0.9 % IV SOLN: 500.0000 mL | INTRAVENOUS | Status: DC

## 2015-06-27 NOTE — Progress Notes (Signed)
A/ox3, pleased with MAC, report to RN 

## 2015-06-27 NOTE — Progress Notes (Signed)
Called to room to assist during endoscopic procedure.  Patient ID and intended procedure confirmed with present staff. Received instructions for my participation in the procedure from the performing physician.  

## 2015-06-27 NOTE — Patient Instructions (Signed)
Colon polyp removed today. Handout given on polyps.    YOU HAD AN ENDOSCOPIC PROCEDURE TODAY AT [facility]:   Refer to the procedure report that was given to you for any specific questions about what was found during the examination.  If the procedure report does not answer your questions, please call your gastroenterologist to clarify.  If you requested that your care partner not be given the details of your procedure findings, then the procedure report has been included in a sealed envelope for you to review at your convenience later.  YOU SHOULD EXPECT: Some feelings of bloating in the abdomen. Passage of more gas than usual.  Walking can help get rid of the air that was put into your GI tract during the procedure and reduce the bloating. If you had a lower endoscopy (such as a colonoscopy or flexible sigmoidoscopy) you may notice spotting of blood in your stool or on the toilet paper. If you underwent a bowel prep for your procedure, you may not have a normal bowel movement for a few days.  Please Note:  You might notice some irritation and congestion in your nose or some drainage.  This is from the oxygen used during your procedure.  There is no need for concern and it should clear up in a day or so.  SYMPTOMS TO REPORT IMMEDIATELY:   Following lower endoscopy (colonoscopy or flexible sigmoidoscopy):  Excessive amounts of blood in the stool  Significant tenderness or worsening of abdominal pains  Swelling of the abdomen that is new, acute  Fever of 100F or higher  For urgent or emergent issues, a gastroenterologist can be reached at any hour by calling [phone].   DIET: Your first meal following the procedure should be a small meal and then it is ok to progress to your normal diet. Heavy or fried foods are harder to digest and may make you feel nauseous or bloated.  Likewise, meals heavy in dairy and vegetables can increase bloating.  Drink plenty of fluids but you  should avoid alcoholic beverages for 24 hours.  ACTIVITY:  You should plan to take it easy for the rest of today and you should NOT DRIVE or use heavy machinery until tomorrow (because of the sedation medicines used during the test).    FOLLOW UP: Our staff will call the number listed on your records the next business day following your procedure to check on you and address any questions or concerns that you may have regarding the information given to you following your procedure. If we do not reach you, we will leave a message.  However, if you are feeling well and you are not experiencing any problems, there is no need to return our call.  We will assume that you have returned to your regular daily activities without incident.  If any biopsies were taken you will be contacted by phone or by letter within the next 1-3 weeks.  Please call us at [phone] if you have not heard about the biopsies in 3 weeks.    SIGNATURES/CONFIDENTIALITY: You and/or your care partner have signed paperwork which will be entered into your electronic medical record.  These signatures attest to the fact that that the information above on your After Visit Summary has been reviewed and is understood.  Full responsibility of the confidentiality of this discharge information lies with you and/or your care-partner.

## 2015-06-28 ENCOUNTER — Telehealth: Payer: Self-pay

## 2015-06-28 NOTE — Op Note (Signed)
[place]  [address]. [place], [address]   COLONOSCOPY PROCEDURE REPORT  PATIENT: Amanda, Woodward  MR#: [id number] BIRTHDATE: June 05, 1951 , 34  yrs. old GENDER: female ENDOSCOPIST: Harl Bowie, MD REFERRED [id number] Laurance Flatten, M.D. PROCEDURE DATE:  06/27/2015 PROCEDURE:   Colonoscopy, screening and Colonoscopy with snare polypectomy First Screening Colonoscopy - Avg.  risk and is 50 yrs.  old or older - No.  Prior Negative Screening - Now for repeat screening. Less than 10 yrs Prior Negative Screening - Now for repeat screening.  Above average risk  History of Adenoma - Now for follow-up colonoscopy & has been > or = to 3 yrs.  N/A  Polyps removed today? Yes ASA CLASS:   Class II INDICATIONS:Screening for colonic neoplasia and FH Colon or Rectal Adenocarcinoma. MEDICATIONS: Propofol 200 mg IV  DESCRIPTION OF PROCEDURE:   After the risks benefits and alternatives of the procedure were thoroughly explained, informed consent was obtained.  The digital rectal exam revealed no abnormalities of the rectum.   The [id number]  endoscope was introduced through the anus and advanced to the cecum, which was identified by both the appendix and ileocecal valve. No adverse events experienced.   The quality of the prep was good.  The instrument was then slowly withdrawn as the colon was fully examined. Estimated blood loss is zero unless otherwise noted in this procedure report.   COLON FINDINGS: A sessile polyp ranging between 5-67mm in size was found at the cecum.  A polypectomy was performed with a cold snare. The resection was complete, the polyp tissue was completely retrieved and sent to histology.  Retroflexed views revealed no abnormalities. The time to cecum = 6.6 Withdrawal time = 9.7   The scope was withdrawn and the procedure completed. COMPLICATIONS: There were no immediate complications.  ENDOSCOPIC IMPRESSION: Sessile polyp ranging between  5-15mm in size was found at the cecum; polypectomy was performed with a cold snare  RECOMMENDATIONS: If the polyp(s) removed today are proven to be adenomatous (pre-cancerous) polyps, you will need a repeat colonoscopy in 5 years.    You will receive a letter within 1-2 weeks with the results of your biopsy as well as final recommendations.  Please call my office if you have not received a letter after 3 weeks.  eSigned:  Harl Bowie, MD 06/27/2015 9:40 AM

## 2015-06-28 NOTE — Telephone Encounter (Signed)
   Follow up Call-  Call back number 06/27/2015  Post procedure Call Back phone  # [phone]  Permission to leave phone message Yes   Patient was called for follow up after procedure on 06-27-15. No answer at the number given. Left a message.

## 2015-07-03 ENCOUNTER — Encounter: Payer: Self-pay | Admitting: Gastroenterology

## 2015-10-16 ENCOUNTER — Encounter: Payer: Self-pay | Admitting: Internal Medicine

## 2016-04-15 ENCOUNTER — Ambulatory Visit (INDEPENDENT_AMBULATORY_CARE_PROVIDER_SITE_OTHER): Payer: Managed Care, Other (non HMO)

## 2016-04-15 DIAGNOSIS — Z23 Encounter for immunization: Secondary | ICD-10-CM

## 2016-08-11 ENCOUNTER — Ambulatory Visit (INDEPENDENT_AMBULATORY_CARE_PROVIDER_SITE_OTHER): Payer: Managed Care, Other (non HMO) | Admitting: Family Medicine

## 2016-08-11 ENCOUNTER — Encounter: Payer: Self-pay | Admitting: Family Medicine

## 2016-08-11 VITALS — BP 125/69 | HR 87 | Temp 100.6°F | Ht 66.0 in | Wt 169.0 lb

## 2016-08-11 DIAGNOSIS — J111 Influenza due to unidentified influenza virus with other respiratory manifestations: Secondary | ICD-10-CM

## 2016-08-11 MED ORDER — OSELTAMIVIR PHOSPHATE 75 MG PO CAPS: 75.0000 mg | ORAL_CAPSULE | Freq: Two times a day (BID) | ORAL | 0 refills | Status: AC

## 2016-08-11 NOTE — Progress Notes (Signed)
Subjective:  Patient ID: Amanda Woodward, female    DOB: 1951/04/09  Age: 66 y.o. MRN: [id number]  CC: Generalized Body Aches (pt here today c/o cough, general body aches, nausea that started last night)   HPI Amanda Woodward presents for  Patient presents with dry cough runny stuffy nose. Diffuse headache of moderate intensity. Patient also has chills and subjective fever. Body aches worst in the back but present in the legs, shoulders, and torso as well. Has sapped the energy to the point that of being unable to perform usual activities other than ADLs. Onset last night  History Amanda Woodward has a past medical history of Allergy; Arthritis; Dry eyes; and GERD (gastroesophageal reflux disease).   She has a past surgical history that includes Tonsillectomy; Cesarean section; Wisdom tooth extraction; VEIN REMOVAL RIGHT LEG; Lipoma excision (LEFT SHOULDER); Colonoscopy; and Mole removal.   Her family history includes Arthritis in her mother; COPD in her father; Cancer in her father and mother; Colon cancer in her mother; Colon polyps in her brother; Heart disease in her father; Pancreatic cancer in her brother; Prostate cancer in her father.She reports that she has never smoked. She has never used smokeless tobacco. She reports that she drinks alcohol. She reports that she does not use drugs.  Current Outpatient Prescriptions on File Prior to Visit  Medication Sig Dispense Refill  . calcium-vitamin D (OSCAL WITH D) 500-200 MG-UNIT tablet Take 1 tablet by mouth daily with breakfast. Reported on 06/27/2015    . cetirizine (ZYRTEC) 10 MG tablet Take 10 mg by mouth daily. Reported on 06/27/2015    . cholecalciferol (VITAMIN D) 1000 UNITS tablet Take 1,000 Units by mouth daily. Reported on 06/27/2015    . ibuprofen (ADVIL,MOTRIN) 200 MG tablet Take 200 mg by mouth every 6 (six) hours as needed. Reported on 06/27/2015    . Lifitegrast 5 % SOLN Apply 1 drop to eye 2 (two) times daily before a meal. Reported on  06/27/2015    . Simethicone (GAS-X PO) Take by mouth as needed. Reported on 06/27/2015    . vitamin E 400 UNIT capsule Take 400 Units by mouth daily.    . Wheat Dextrin (BENEFIBER PO) Take by mouth daily.     No current facility-administered medications on file prior to visit.     ROS Review of Systems  Constitutional: Positive for appetite change, chills and fever.  HENT: Positive for congestion and rhinorrhea. Negative for ear pain, nosebleeds, postnasal drip, sinus pressure and sore throat.   Respiratory: Negative for chest tightness and shortness of breath.   Cardiovascular: Negative for chest pain.  Musculoskeletal: Positive for myalgias.  Skin: Negative for rash.  Neurological: Positive for headaches.    Objective:  BP 125/69   Pulse 87   Temp (!) 100.6 F (38.1 C) (Oral)   Ht 5\' 6"  (1.676 m)   Wt 169 lb (76.7 kg)   BMI 27.28 kg/m   Physical Exam  Constitutional: She is oriented to person, place, and time. She appears well-developed and well-nourished. No distress.  HENT:  Head: Normocephalic and atraumatic.  Eyes: Conjunctivae are normal. Pupils are equal, round, and reactive to light.  Neck: Normal range of motion. Neck supple. No thyromegaly present.  Cardiovascular: Normal rate, regular rhythm and normal heart sounds.   No murmur heard. Pulmonary/Chest: Effort normal and breath sounds normal. No respiratory distress. She has no wheezes. She has no rales.  Abdominal: Soft. Bowel sounds are normal. She exhibits no distension. There  is no tenderness.  Musculoskeletal: Normal range of motion.  Lymphadenopathy:    She has no cervical adenopathy.  Neurological: She is alert and oriented to person, place, and time.  Skin: Skin is warm and dry.  Psychiatric: She has a normal mood and affect. Her behavior is normal. Judgment and thought content normal.    Assessment & Plan:   Zoie was seen today for generalized body aches.  Diagnoses and all orders for this  visit:  Influenza with respiratory manifestation  Other orders -     oseltamivir (TAMIFLU) 75 MG capsule; Take 1 capsule (75 mg total) by mouth 2 (two) times daily.   I have discontinued Ms. Verbeke's naproxen sodium, solifenacin, and prednisoLONE acetate. I am also having her start on oseltamivir. Additionally, I am having her maintain her calcium-vitamin D, vitamin E, cholecalciferol, Lifitegrast, ibuprofen, cetirizine, Simethicone (GAS-X PO), and Wheat Dextrin (BENEFIBER PO).  Meds ordered this encounter  Medications  . oseltamivir (TAMIFLU) 75 MG capsule    Sig: Take 1 capsule (75 mg total) by mouth 2 (two) times daily.    Dispense:  10 capsule    Refill:  0     Follow-up: No Follow-up on file.  Claretta Fraise, M.D.

## 2016-08-12 ENCOUNTER — Telehealth: Payer: Self-pay | Admitting: Family Medicine

## 2016-08-12 MED ORDER — GUAIFENESIN-CODEINE 100-10 MG/5ML PO SYRP: 5.0000 mL | ORAL_SOLUTION | ORAL | 0 refills | Status: AC | PRN

## 2016-08-12 NOTE — Telephone Encounter (Signed)
Actually, the narcotic content of that medication is too high for me to prescribe.

## 2016-08-12 NOTE — Telephone Encounter (Signed)
Call in Cheratussin AC 1-2 Tsp q 4 hours prn cough, 6 oz.  Thanks, WS

## 2016-08-12 NOTE — Telephone Encounter (Signed)
RX called into [facility] per Dr Livia Snellen Pt notified

## 2017-05-06 ENCOUNTER — Ambulatory Visit (INDEPENDENT_AMBULATORY_CARE_PROVIDER_SITE_OTHER): Payer: Managed Care, Other (non HMO) | Admitting: *Deleted

## 2017-05-06 DIAGNOSIS — Z23 Encounter for immunization: Secondary | ICD-10-CM | POA: Diagnosis not present

## 2017-06-24 ENCOUNTER — Other Ambulatory Visit: Payer: Self-pay | Admitting: Internal Medicine

## 2017-06-24 DIAGNOSIS — Z1231 Encounter for screening mammogram for malignant neoplasm of breast: Secondary | ICD-10-CM

## 2017-07-24 ENCOUNTER — Ambulatory Visit: Payer: 59

## 2017-08-14 ENCOUNTER — Ambulatory Visit
Admission: RE | Admit: 2017-08-14 | Discharge: 2017-08-14 | Disposition: A | Discharge status: Home / Self Care | Payer: Managed Care, Other (non HMO) | Source: Ambulatory Visit | Attending: Internal Medicine | Admitting: Internal Medicine

## 2017-08-14 DIAGNOSIS — Z1231 Encounter for screening mammogram for malignant neoplasm of breast: Secondary | ICD-10-CM

## 2017-08-18 ENCOUNTER — Other Ambulatory Visit: Payer: Self-pay | Admitting: Internal Medicine

## 2017-08-18 DIAGNOSIS — R928 Other abnormal and inconclusive findings on diagnostic imaging of breast: Secondary | ICD-10-CM

## 2017-08-21 ENCOUNTER — Ambulatory Visit
Admission: RE | Admit: 2017-08-21 | Discharge: 2017-08-21 | Disposition: A | Discharge status: Home / Self Care | Payer: Managed Care, Other (non HMO) | Source: Ambulatory Visit | Attending: Internal Medicine | Admitting: Internal Medicine

## 2017-08-21 DIAGNOSIS — R928 Other abnormal and inconclusive findings on diagnostic imaging of breast: Secondary | ICD-10-CM

## 2017-10-07 ENCOUNTER — Other Ambulatory Visit: Payer: Self-pay | Admitting: Internal Medicine

## 2017-10-07 ENCOUNTER — Other Ambulatory Visit (HOSPITAL_COMMUNITY)
Admission: RE | Admit: 2017-10-07 | Discharge: 2017-10-07 | Disposition: A | Discharge status: Home / Self Care | Payer: Managed Care, Other (non HMO) | Source: Ambulatory Visit | Attending: Internal Medicine | Admitting: Internal Medicine

## 2017-10-07 DIAGNOSIS — Z124 Encounter for screening for malignant neoplasm of cervix: Secondary | ICD-10-CM | POA: Diagnosis present

## 2017-10-12 LAB — CYTOLOGY - PAP
Diagnosis: NEGATIVE
HPV (WINDOPATH): NOT DETECTED

## 2018-08-13 ENCOUNTER — Other Ambulatory Visit: Payer: Self-pay | Admitting: Internal Medicine

## 2018-08-13 DIAGNOSIS — Z1231 Encounter for screening mammogram for malignant neoplasm of breast: Secondary | ICD-10-CM

## 2018-09-10 ENCOUNTER — Ambulatory Visit
Admission: RE | Admit: 2018-09-10 | Discharge: 2018-09-10 | Disposition: A | Discharge status: Home / Self Care | Payer: Medicare Other | Source: Ambulatory Visit | Attending: Internal Medicine | Admitting: Internal Medicine

## 2018-09-10 DIAGNOSIS — Z1231 Encounter for screening mammogram for malignant neoplasm of breast: Secondary | ICD-10-CM

## 2018-09-14 ENCOUNTER — Other Ambulatory Visit: Payer: Self-pay

## 2018-09-14 DIAGNOSIS — I83893 Varicose veins of bilateral lower extremities with other complications: Secondary | ICD-10-CM

## 2018-09-16 ENCOUNTER — Encounter: Payer: Self-pay | Admitting: Vascular Surgery

## 2018-09-16 ENCOUNTER — Other Ambulatory Visit: Payer: Self-pay

## 2018-09-16 ENCOUNTER — Ambulatory Visit (HOSPITAL_COMMUNITY)
Admission: RE | Admit: 2018-09-16 | Discharge: 2018-09-16 | Disposition: A | Discharge status: Home / Self Care | Payer: Medicare Other | Source: Ambulatory Visit | Attending: Family | Admitting: Family

## 2018-09-16 ENCOUNTER — Ambulatory Visit (INDEPENDENT_AMBULATORY_CARE_PROVIDER_SITE_OTHER): Payer: Medicare Other | Admitting: Vascular Surgery

## 2018-09-16 VITALS — BP 127/84 | HR 70 | Temp 97.0°F | Resp 16 | Ht 66.0 in | Wt 168.0 lb

## 2018-09-16 DIAGNOSIS — I872 Venous insufficiency (chronic) (peripheral): Secondary | ICD-10-CM

## 2018-09-16 DIAGNOSIS — I83893 Varicose veins of bilateral lower extremities with other complications: Secondary | ICD-10-CM | POA: Insufficient documentation

## 2018-09-16 NOTE — Progress Notes (Signed)
REASON FOR CONSULT:    Painful varicose veins bilaterally with swelling.  The patient is self-referred.  ASSESSMENT & PLAN:   CHRONIC VENOUS INSUFFICIENCY: This patient has CEAP C3 venous disease.  She does have significant deep venous reflux bilaterally but really no significant superficial venous reflux.  She has some varicose veins in her posterior right calf.  She has spider veins bilaterally and also reticular veins in both legs mostly behind the knees.  I have discussed with her the importance of intermittent leg elevation and the proper positioning for this.  I have written her a prescription for knee-high compression stockings with a gradient of 15 to 20 mmHg.  I think these are the most practical.  I have encouraged her to avoid prolonged sitting and standing.  We have discussed the importance of exercise specifically walking and water aerobics.  We also discussed the importance of maintaining a healthy weight.  Currently she does not have significant superficial venous reflux.  I explained that she really does not require any other more invasive vascular procedures.  She could consider sclerotherapy if her spider veins were significantly bothersome.  I will be happy to see her back at any time if her venous symptoms or varicose veins progress.   Deitra Mayo, MD, FACS Bee[facility] [phone] Office: [phone]   HPI:   Amanda Woodward is a pleasant 68 y.o. 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  Her symptoms involve aching pain and heaviness when she standing and sitting.  The symptoms are relieved somewhat with elevation.  She travels quite a bit and her legs bother her most when she has been on a long trip.  She is unaware of any previous history of DVT or phlebitis.  She does elevate her legs which helps with her symptoms.  She typically does not wear compression stockings.  She has not required ibuprofen.  She does note some bilateral lower  extremity swelling.  She is otherwise fairly healthy.  Past Medical History:  Diagnosis Date  . Allergy   . Arthritis   . Dry eyes   . GERD (gastroesophageal reflux disease)    may be developing- 03-30-15 had chest pain but not heart related( r/o in ED)     Family History  Problem Relation Age of Onset  . Cancer Mother   . Arthritis Mother   . Colon cancer Mother   . COPD Father   . Cancer Father   . Heart disease Father   . Prostate cancer Father   . Colon polyps Brother   . Pancreatic cancer Brother   . Rectal cancer Neg Hx   . Stomach cancer Neg Hx   . Esophageal cancer Neg Hx     SOCIAL HISTORY: Social History   Socioeconomic History  . Marital status: Married    Spouse name: Not on file  . Number of children: Not on file  . Years of education: Not on file  . Highest education level: Not on file  Occupational History  . Not on file  Social Needs  . Financial resource strain: Not on file  . Food insecurity:    Worry: Not on file    Inability: Not on file  . Transportation needs:    Medical: Not on file    Non-medical: Not on file  Tobacco Use  . Smoking status: Never Smoker  . Smokeless tobacco: Never Used  Substance and Sexual Activity  . Alcohol use: Yes  Alcohol/week: 0.0 standard drinks    Comment: occ beer   . Drug use: No  . Sexual activity: Not on file  Lifestyle  . Physical activity:    Days per week: Not on file    Minutes per session: Not on file  . Stress: Not on file  Relationships  . Social connections:    Talks on phone: Not on file    Gets together: Not on file    Attends religious service: Not on file    Active member of club or organization: Not on file    Attends meetings of clubs or organizations: Not on file    Relationship status: Not on file  . Intimate partner violence:    Fear of current or ex partner: Not on file    Emotionally abused: Not on file    Physically abused: Not on file    Forced sexual activity: Not on  file  Other Topics Concern  . Not on file  Social History Narrative  . Not on file    Allergies  Allergen Reactions  . Benadryl [Diphenhydramine Hcl] Other (See Comments)    agitation    Current Outpatient Medications  Medication Sig Dispense Refill  . calcium-vitamin D (OSCAL WITH D) 500-200 MG-UNIT tablet Take 1 tablet by mouth daily with breakfast. Reported on 06/27/2015    . cetirizine (ZYRTEC) 10 MG tablet Take 10 mg by mouth daily. Reported on 06/27/2015    . cholecalciferol (VITAMIN D) 1000 UNITS tablet Take 1,000 Units by mouth daily. Reported on 06/27/2015    . guaiFENesin-codeine (CHERATUSSIN AC) 100-10 MG/5ML syrup Take 5 mLs by mouth every 4 (four) hours as needed for cough. 120 mL 0  . ibuprofen (ADVIL,MOTRIN) 200 MG tablet Take 200 mg by mouth every 6 (six) hours as needed. Reported on 06/27/2015    . Lifitegrast 5 % SOLN Apply 1 drop to eye 2 (two) times daily before a meal. Reported on 06/27/2015    . oseltamivir (TAMIFLU) 75 MG capsule Take 1 capsule (75 mg total) by mouth 2 (two) times daily. 10 capsule 0  . Simethicone (GAS-X PO) Take by mouth as needed. Reported on 06/27/2015    . vitamin E 400 UNIT capsule Take 400 Units by mouth daily.    . Wheat Dextrin (BENEFIBER PO) Take by mouth daily.     No current facility-administered medications for this visit.     REVIEW OF SYSTEMS:  [X]  denotes positive finding, [ ]  denotes negative finding Cardiac  Comments:  Chest pain or chest pressure:    Shortness of breath upon exertion: x   Short of breath when lying flat:    Irregular heart rhythm:        Vascular    Pain in calf, thigh, or hip brought on by ambulation:    Pain in feet at night that wakes you up from your sleep:     Blood clot in your veins:    Leg swelling:  x       Pulmonary    Oxygen at home:    Productive cough:     Wheezing:         Neurologic    Sudden weakness in arms or legs:     Sudden numbness in arms or legs:     Sudden onset of  difficulty speaking or slurred speech:    Temporary loss of vision in one eye:     Problems with dizziness:  Gastrointestinal    Blood in stool:     Vomited blood:         Genitourinary    Burning when urinating:     Blood in urine:        Psychiatric    Major depression:         Hematologic    Bleeding problems:    Problems with blood clotting too easily:        Skin    Rashes or ulcers:        Constitutional    Fever or chills:     PHYSICAL EXAM:   Vitals:   09/16/18 1448  BP: 127/84  Pulse: 70  Resp: 16  Temp: (!) 97 F (36.1 C)  TempSrc: Oral  SpO2: 100%  Weight: 168 lb (76.2 kg)  Height: 5\' 6"  (1.676 m)    GENERAL: The patient is a well-nourished female, in no acute distress. The vital signs are documented above. CARDIAC: There is a regular rate and rhythm.  VASCULAR: I do not detect carotid bruits. She has palpable pedal pulses bilaterally. VENOUS EXAM: She does have some varicose veins in her posterior right calf.  These are moderate in size.  She also has spider veins in both her thighs and legs bilaterally and some reticular veins mostly in the posterior distal thigh and proximal calf bilaterally. PULMONARY: There is good air exchange bilaterally without wheezing or rales. ABDOMEN: Soft and non-tender with normal pitched bowel sounds.  MUSCULOSKELETAL: There are no major deformities or cyanosis. NEUROLOGIC: No focal weakness or paresthesias are detected. SKIN: There are no ulcers or rashes noted. PSYCHIATRIC: The patient has a normal affect.  DATA:    VENOUS DUPLEX: I have independently interpreted her venous duplex scan today.  On the right side there is no evidence of DVT or superficial thrombophlebitis.  There is deep venous reflux involving the common femoral vein, femoral vein, and popliteal vein.  There is superficial venous reflux on the right at the saphenofemoral junction only.  On the left side there is no evidence of DVT or  superficial thrombophlebitis.  There is deep venous reflux involving the common femoral vein, femoral vein, and popliteal vein.  There is no reflux at the saphenofemoral junction.  There is a short segment of reflux in the proximal thigh on the left.  There is some reflux in the proximal small saphenous vein although this is not especially enlarged.

## 2020-05-27 IMAGING — MG DIGITAL SCREENING BILATERAL MAMMOGRAM WITH TOMO AND CAD
8 series · 8 of 24 positions shown · non-contrast
Comparison: Previous exam(s).

CLINICAL DATA: Screening.

EXAM:
DIGITAL SCREENING BILATERAL MAMMOGRAM WITH TOMO AND CAD

[R CC synth-2D]
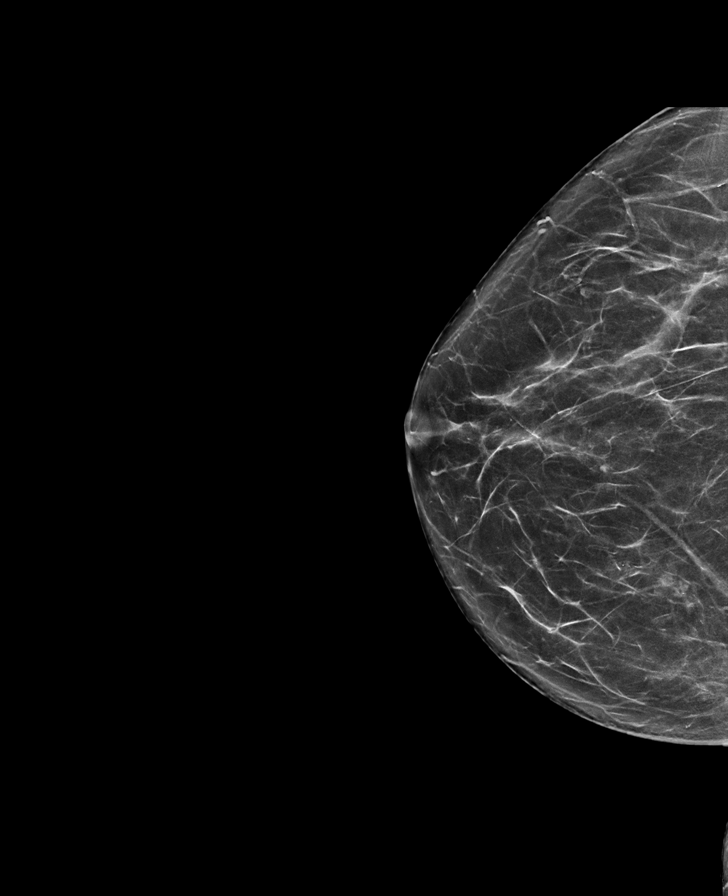

[R MLO synth-2D]
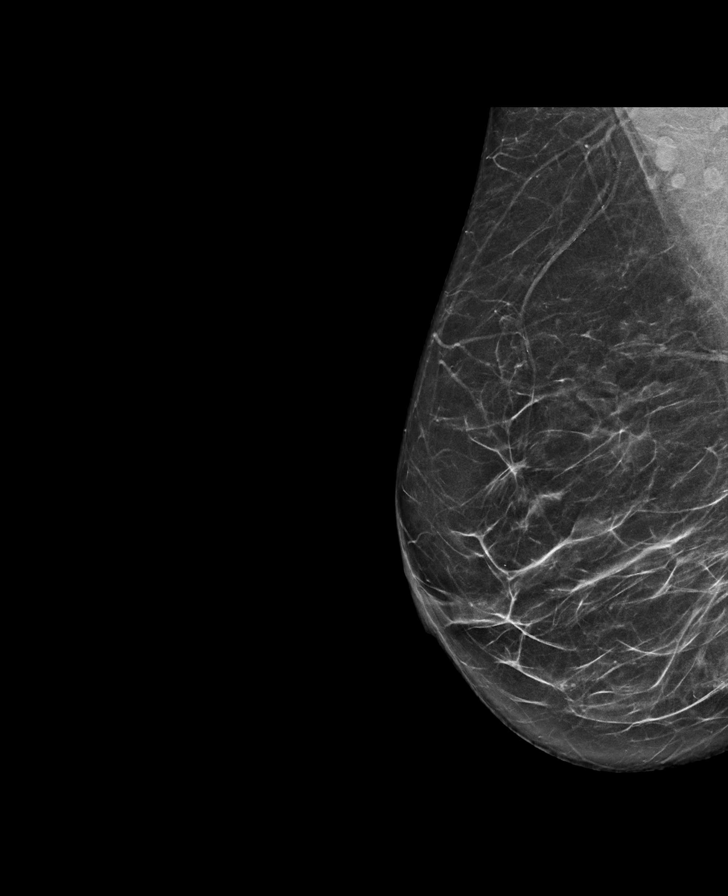

[L MLO synth-2D]
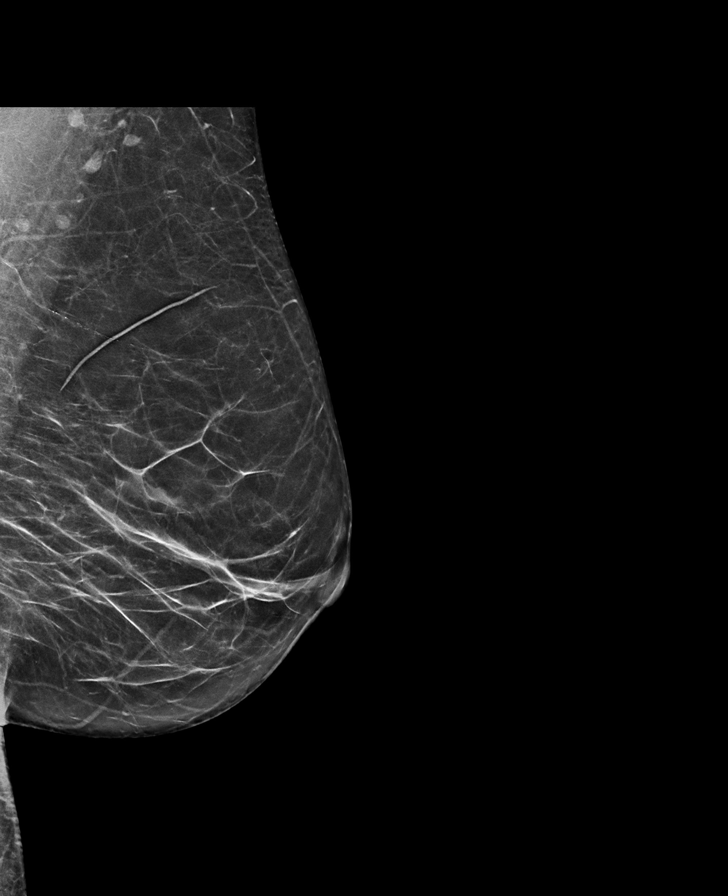

[L CC synth-2D]
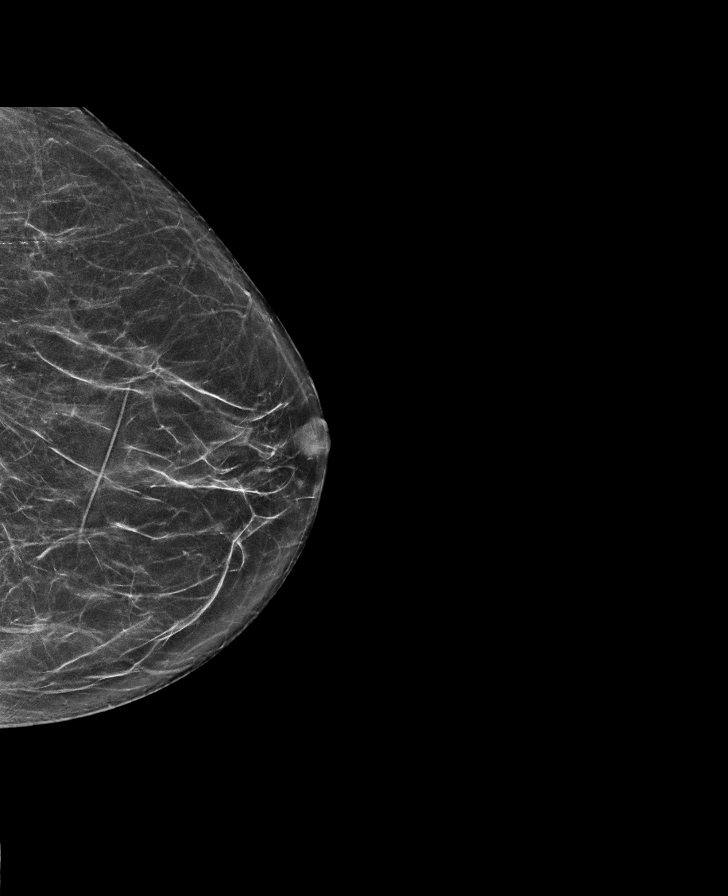

[L CC tomo · tomo slice 31/60.0]
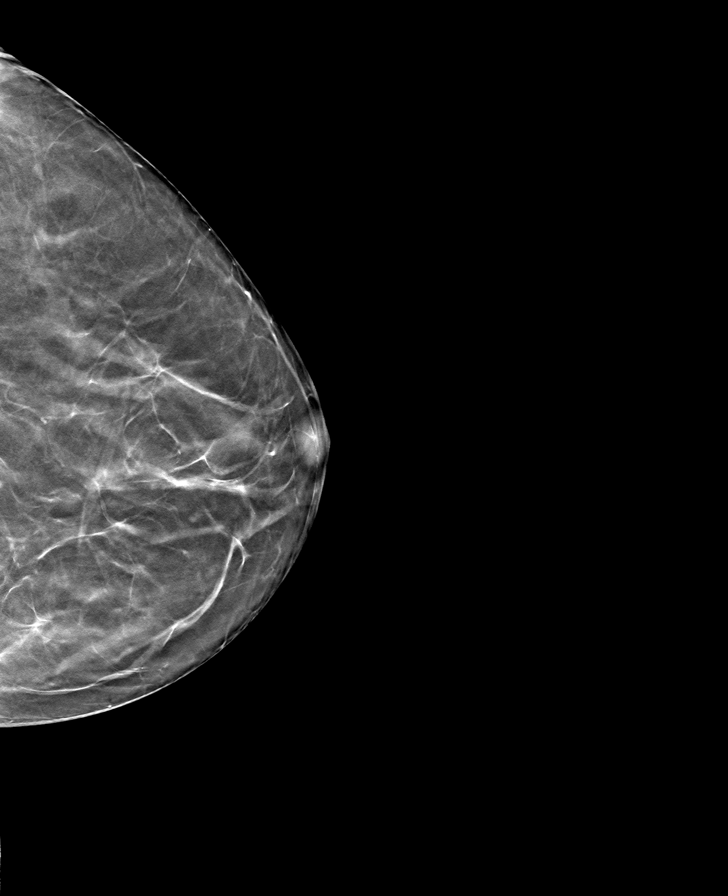

[R CC tomo · tomo slice 35/70.0]
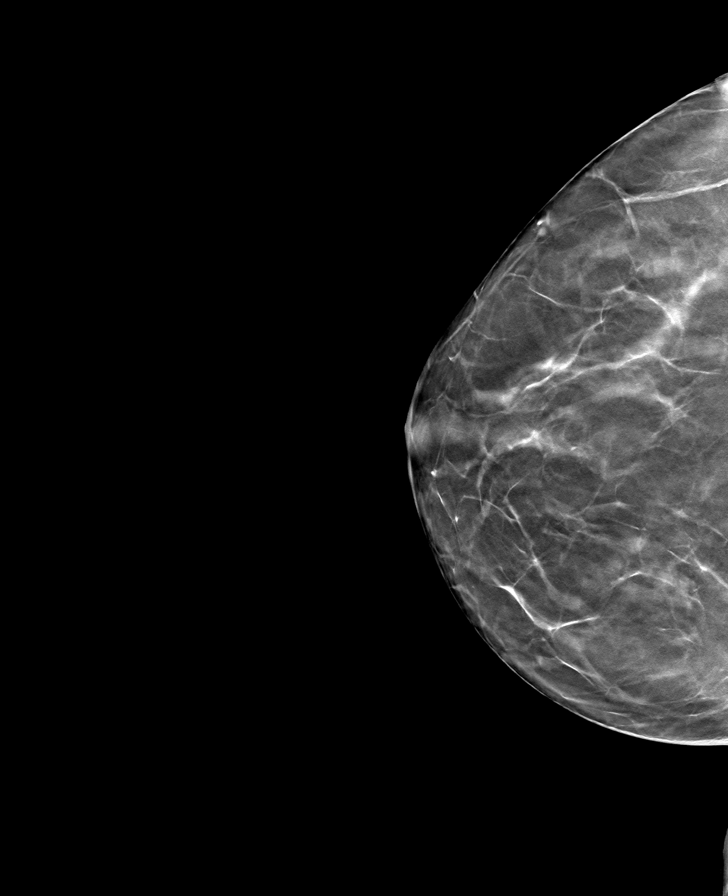

[R MLO tomo · tomo slice 35/68.0]
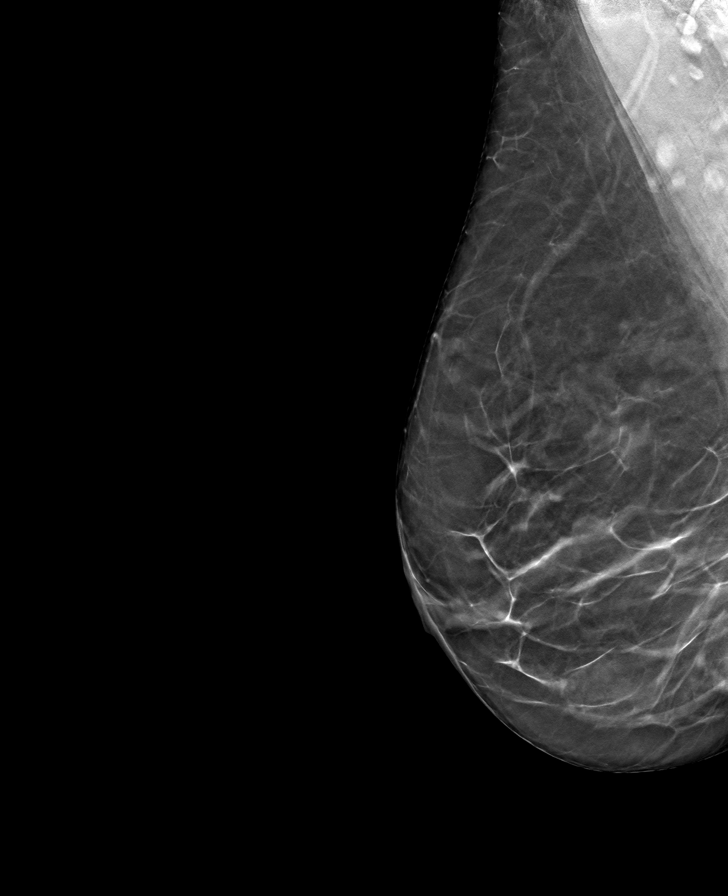

[L MLO tomo · tomo slice 33/64.0]
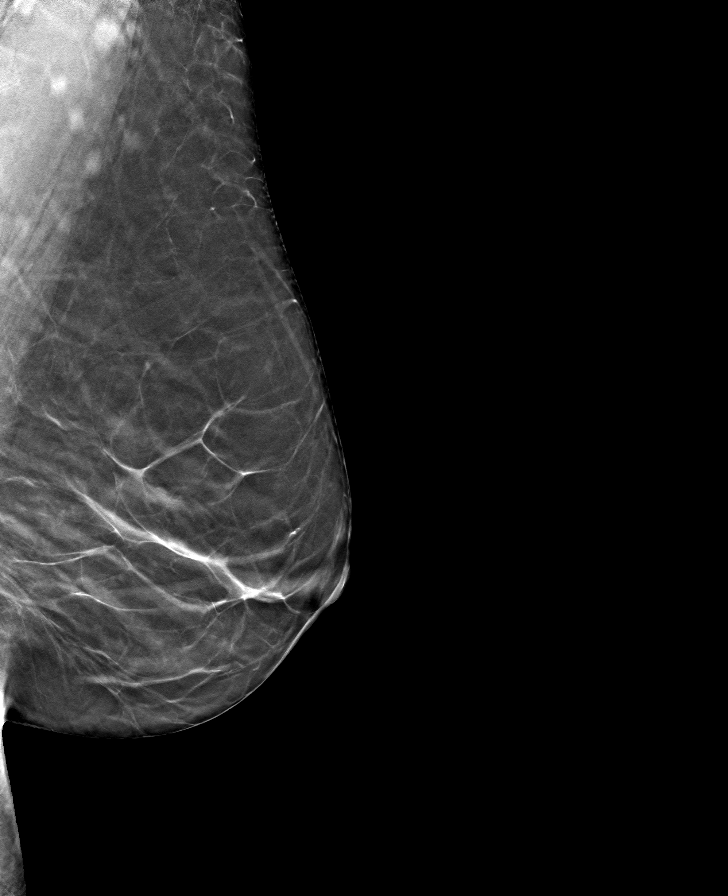

[8 of 24 positions shown; findings below may reference images not displayed]

ACR Breast Density Category b: There are scattered areas of
fibroglandular density.
FINDINGS: There are no findings suspicious for malignancy. Images were
processed with CAD.
IMPRESSION: No mammographic evidence of malignancy. A result letter of this
screening mammogram will be mailed directly to the patient.

RECOMMENDATION:
Screening mammogram in one year. (Code:[id number])

BI-RADS CATEGORY  1: Negative.

## 2020-08-30 ENCOUNTER — Encounter: Payer: Self-pay | Admitting: Gastroenterology
